# Patient Record
Sex: Female | Born: 1950 | Hispanic: Yes | Smoking: Never smoker
Health system: Southern US, Community
[De-identification: ages and names within clinical notes are randomized; demographics above are authoritative.]

## PROBLEM LIST (undated history)

## (undated) DIAGNOSIS — E079 Disorder of thyroid, unspecified: Secondary | ICD-10-CM

---

## 1994-01-29 HISTORY — PX: BREAST BIOPSY: SHX20

## 2014-08-17 ENCOUNTER — Other Ambulatory Visit: Payer: Self-pay | Admitting: *Deleted

## 2014-08-17 DIAGNOSIS — Z1231 Encounter for screening mammogram for malignant neoplasm of breast: Secondary | ICD-10-CM

## 2014-09-02 ENCOUNTER — Ambulatory Visit
Admission: RE | Admit: 2014-09-02 | Discharge: 2014-09-02 | Disposition: A | Discharge status: Home / Self Care | Payer: Medicare HMO | Source: Ambulatory Visit | Attending: *Deleted | Admitting: *Deleted

## 2014-09-02 DIAGNOSIS — Z1231 Encounter for screening mammogram for malignant neoplasm of breast: Secondary | ICD-10-CM

## 2014-09-06 ENCOUNTER — Other Ambulatory Visit: Payer: Self-pay | Admitting: *Deleted

## 2014-09-06 DIAGNOSIS — R928 Other abnormal and inconclusive findings on diagnostic imaging of breast: Secondary | ICD-10-CM

## 2014-09-10 ENCOUNTER — Ambulatory Visit
Admission: RE | Admit: 2014-09-10 | Discharge: 2014-09-10 | Disposition: A | Discharge status: Home / Self Care | Payer: Medicare HMO | Source: Ambulatory Visit | Attending: *Deleted | Admitting: *Deleted

## 2014-09-10 DIAGNOSIS — R928 Other abnormal and inconclusive findings on diagnostic imaging of breast: Secondary | ICD-10-CM

## 2015-08-18 ENCOUNTER — Encounter (HOSPITAL_COMMUNITY): Payer: Self-pay | Admitting: Emergency Medicine

## 2015-08-18 ENCOUNTER — Emergency Department (HOSPITAL_COMMUNITY)
Admission: EM | Admit: 2015-08-18 | Discharge: 2015-08-18 | Disposition: A | Discharge status: Home / Self Care | Payer: PRIVATE HEALTH INSURANCE | Attending: Emergency Medicine | Admitting: Emergency Medicine

## 2015-08-18 DIAGNOSIS — J069 Acute upper respiratory infection, unspecified: Secondary | ICD-10-CM

## 2015-08-18 DIAGNOSIS — H6092 Unspecified otitis externa, left ear: Secondary | ICD-10-CM | POA: Insufficient documentation

## 2015-08-18 DIAGNOSIS — J309 Allergic rhinitis, unspecified: Secondary | ICD-10-CM | POA: Insufficient documentation

## 2015-08-18 HISTORY — DX: Disorder of thyroid, unspecified: E07.9

## 2015-08-18 MED ORDER — BENZONATATE 100 MG PO CAPS: 100.0000 mg | ORAL_CAPSULE | Freq: Three times a day (TID) | ORAL | Status: AC | PRN

## 2015-08-18 MED ORDER — CIPROFLOXACIN-DEXAMETHASONE 0.3-0.1 % OT SUSP
4.0000 [drp] | Freq: Two times a day (BID) | OTIC | Status: AC
  Administered 2015-08-18: 4 [drp] via OTIC
  Filled 2015-08-18: qty 7.5

## 2015-08-18 MED ORDER — CETIRIZINE HCL 10 MG PO TABS: 10.0000 mg | ORAL_TABLET | Freq: Every day | ORAL | Status: AC

## 2015-08-18 MED ORDER — FLUTICASONE PROPIONATE 50 MCG/ACT NA SUSP: 2.0000 | Freq: Every day | NASAL | Status: AC

## 2015-08-18 NOTE — ED Provider Notes (Signed)
CSN: [id number]     Arrival date & time 08/18/15  [id number] History   First MD Initiated Contact with Patient 08/18/15 [phone]     Chief Complaint  Patient presents with  . URI    Haley Haynes is a 65 y.o. female who presents to the ED complaining of A cough that is been ongoing for approximately 5 weeks with associated symptoms of nasal congestion, postnasal drip and sinus pressure. Patient also reports her left ear has had pain and pressure as well as muffled hearing. She reports about one week ago she went to [place] where she had a CXR that was unremarkable. She was not sent home with any medications. She has been using home remedies to help with her symptoms. She reports her symptoms began when she came to the area to visit her mother. She typically lives in [place]. Patient denies fevers, shortness of breath, chest pain, abdominal pain, nausea, vomiting, diarrhea, headaches, sore throat, trouble swallowing, or rashes.  Patient is a 65 y.o. female presenting with URI. The history is provided by the patient. No language interpreter was used.  URI Presenting symptoms: congestion, cough, ear pain and rhinorrhea   Presenting symptoms: no fever and no sore throat   Associated symptoms: no headaches, no neck pain and no wheezing     Past Medical History  Diagnosis Date  . Thyroid disease    History reviewed. No pertinent past surgical history. History reviewed. No pertinent family history. Social History  Substance Use Topics  . Smoking status: Never Smoker   . Smokeless tobacco: None  . Alcohol Use: None   OB History    No data available     Review of Systems  Constitutional: Negative for fever and chills.  HENT: Positive for congestion, ear pain, postnasal drip and rhinorrhea. Negative for ear discharge, facial swelling, mouth sores, nosebleeds, sore throat and trouble swallowing.   Eyes: Negative for visual disturbance.  Respiratory: Positive for cough. Negative for chest  tightness, shortness of breath and wheezing.   Cardiovascular: Negative for chest pain.  Gastrointestinal: Negative for nausea, vomiting and abdominal pain.  Musculoskeletal: Negative for back pain, neck pain and neck stiffness.  Skin: Negative for rash.  Neurological: Negative for dizziness, light-headedness and headaches.      Allergies  Benadryl and Tylenol  Home Medications   Prior to Admission medications   Medication Sig Start Date End Date Taking? Authorizing Provider  benzonatate (TESSALON) 100 MG capsule Take 1 capsule (100 mg total) by mouth 3 (three) times daily as needed for cough. 08/18/15   Waynetta Pean, PA-C  cetirizine (ZYRTEC ALLERGY) 10 MG tablet Take 1 tablet (10 mg total) by mouth daily. 08/18/15   Waynetta Pean, PA-C  fluticasone (FLONASE) 50 MCG/ACT nasal spray Place 2 sprays into both nostrils daily. 08/18/15   Waynetta Pean, PA-C   BP 127/76 mmHg  Pulse 71  Temp(Src) 97.9 F (36.6 C) (Oral)  Resp 18  SpO2 98% Physical Exam  Constitutional: She appears well-developed and well-nourished. No distress.  Nontoxic appearing.  HENT:  Head: Normocephalic and atraumatic.  Right Ear: External ear normal.  Left Ear: External ear normal.  Mouth/Throat: Oropharynx is clear and moist. No oropharyngeal exudate.  No tonsillar Birtcher exudates. Evidence of postnasal drip. Uvula is midline without edema. Will left ear with mild external auditory canal edema with whitish discharge. TM is partially visualized and appears intact. Hearing is decreased in her left ear. Right ear normal. No TM erythema or loss  of landmarks. No mastoid tenderness bilaterally. Boggy nasal turbinates bilaterally.  Eyes: Conjunctivae are normal. Pupils are equal, round, and reactive to light. Right eye exhibits no discharge. Left eye exhibits no discharge.  Neck: Normal range of motion. Neck supple. No JVD present. No tracheal deviation present.  Cardiovascular: Normal rate, regular rhythm,  normal heart sounds and intact distal pulses.   Pulmonary/Chest: Effort normal and breath sounds normal. No stridor. No respiratory distress. She has no wheezes. She has no rales.  Lungs are clear to auscultation bilaterally. No increased work of breathing.  Abdominal: Soft. There is no tenderness. There is no guarding.  Lymphadenopathy:    She has no cervical adenopathy.  Neurological: She is alert. Coordination normal.  Skin: Skin is warm and dry. No rash noted. She is not diaphoretic. No erythema. No pallor.  Psychiatric: She has a normal mood and affect. Her behavior is normal.  Nursing note and vitals reviewed.   ED Course  Procedures (including critical care time) Labs Review Labs Reviewed - No data to display  Imaging Review No results found.    EKG Interpretation None      Filed Vitals:   08/18/15 0838  BP: 127/76  Pulse: 71  Temp: 97.9 F (36.6 C)  TempSrc: Oral  Resp: 18  SpO2: 98%     MDM   Meds given in ED:  Medications  ciprofloxacin-dexamethasone (CIPRODEX) 0.3-0.1 % otic suspension 4 drop (not administered)    New Prescriptions   BENZONATATE (TESSALON) 100 MG CAPSULE    Take 1 capsule (100 mg total) by mouth 3 (three) times daily as needed for cough.   CETIRIZINE (ZYRTEC ALLERGY) 10 MG TABLET    Take 1 tablet (10 mg total) by mouth daily.   FLUTICASONE (FLONASE) 50 MCG/ACT NASAL SPRAY    Place 2 sprays into both nostrils daily.    Final diagnoses:  URI (upper respiratory infection)  Allergic rhinitis, unspecified allergic rhinitis type  Otitis externa, left   This  is a 65 y.o. female who presents to the ED complaining of A cough that is been ongoing for approximately 5 weeks with associated symptoms of nasal congestion, postnasal drip and sinus pressure. Patient also reports her left ear has had pain and pressure as well as muffled hearing. She reports about one week ago she went to [place] where she had a CXR that was unremarkable. She was not  sent home with any medications. She has been using home remedies to help with her symptoms. She reports her symptoms began when she came to the area to visit her mother.  On exam the patient is afebrile nontoxic appearing. Her lungs are clear to auscultation bilaterally. She has evidence of postnasal drip boggy nasal turbinates. She has otitis externa on her left ear. No mastoid tenderness. She is not diabetic. No concern for malignant otitis externa. Patient had a negative chest x-ray recently. I suspect patient's cough is coming from her allergy and upper respiratory symptoms. Will start her on Ciprodex in the emergency department and discharged with this medication. Advised to use 4 drops in her left ear twice a day. Advised if she continues to have problems with muffled hearing she is to follow-up with ENT. Will also start on Zyrtec, Tessalon Perles and zyrtec, flonase nasal spray. I discussed strict and specific return precautions. I advised the patient to follow-up with their primary care provider this week. I advised the patient to return to the emergency department with new or worsening symptoms  or new concerns. The patient verbalized understanding and agreement with plan.       Waynetta Pean, PA-C 08/18/15 [id number]  Margette Fast, MD 08/19/15 [phone]

## 2015-08-18 NOTE — ED Notes (Signed)
PT ambulated with baseline gait; VSS; A&Ox3; no signs of distress; respirations even and unlabored; skin warm and dry; no questions upon discharge.  

## 2015-08-18 NOTE — ED Notes (Signed)
Patient lives in [place]; has been here for 6 weeks. Has cough- productive with clear phelgm. Left ear muffled. Went to ER at [place]; chest xray clear. Sinus pressure reported currently. Denies night sweats or fevers.

## 2015-08-18 NOTE — Discharge Instructions (Signed)
Otitis Externa Otitis externa is a bacterial or fungal infection of the outer ear canal. This is the area from the eardrum to the outside of the ear. Otitis externa is sometimes called "swimmer's ear." CAUSES  Possible causes of infection include:  Swimming in dirty water.  Moisture remaining in the ear after swimming or bathing.  Mild injury (trauma) to the ear.  Objects stuck in the ear (foreign body).  Cuts or scrapes (abrasions) on the outside of the ear. SIGNS AND SYMPTOMS  The first symptom of infection is often itching in the ear canal. Later signs and symptoms may include swelling and redness of the ear canal, ear pain, and yellowish-white fluid (pus) coming from the ear. The ear pain may be worse when pulling on the earlobe. DIAGNOSIS  Your health care provider will perform a physical exam. A sample of fluid may be taken from the ear and examined for bacteria or fungi. TREATMENT  Antibiotic ear drops are often given for 10 to 14 days. Treatment may also include pain medicine or corticosteroids to reduce itching and swelling. HOME CARE INSTRUCTIONS   Apply antibiotic ear drops to the ear canal as prescribed by your health care provider.  Take medicines only as directed by your health care provider.  If you have diabetes, follow any additional treatment instructions from your health care provider.  Keep all follow-up visits as directed by your health care provider. PREVENTION   Keep your ear dry. Use the corner of a towel to absorb water out of the ear canal after swimming or bathing.  Avoid scratching or putting objects inside your ear. This can damage the ear canal or remove the protective wax that lines the canal. This makes it easier for bacteria and fungi to grow.  Avoid swimming in lakes, polluted water, or poorly chlorinated pools.  You may use ear drops made of rubbing alcohol and vinegar after swimming. Combine equal parts of white vinegar and alcohol in a bottle.  Put 3 or 4 drops into each ear after swimming. SEEK MEDICAL CARE IF:   You have a fever.  Your ear is still red, swollen, painful, or draining pus after 3 days.  Your redness, swelling, or pain gets worse.  You have a severe headache.  You have redness, swelling, pain, or tenderness in the area behind your ear. MAKE SURE YOU:   Understand these instructions.  Will watch your condition.  Will get help right away if you are not doing well or get worse.   This information is not intended to replace advice given to you by your health care provider. Make sure you discuss any questions you have with your health care provider.   Document Released: 01/15/2005 Document Revised: 02/05/2014 Document Reviewed: 02/01/2011 Elsevier Interactive Patient Education 2016 [employer].  Allergic Rhinitis Allergic rhinitis is when the mucous membranes in the nose respond to allergens. Allergens are particles in the air that cause your body to have an allergic reaction. This causes you to release allergic antibodies. Through a chain of events, these eventually cause you to release histamine into the blood stream. Although meant to protect the body, it is this release of histamine that causes your discomfort, such as frequent sneezing, congestion, and an itchy, runny nose.  CAUSES Seasonal allergic rhinitis (hay fever) is caused by pollen allergens that may come from grasses, trees, and weeds. Year-round allergic rhinitis (perennial allergic rhinitis) is caused by allergens such as house dust mites, pet dander, and mold spores. SYMPTOMS  Nasal stuffiness (congestion).  Itchy, runny nose with sneezing and tearing of the eyes. DIAGNOSIS Your health care provider can help you determine the allergen or allergens that trigger your symptoms. If you and your health care provider are unable to determine the allergen, skin or blood testing may be used. Your health care provider will diagnose your condition after  taking your health history and performing a physical exam. Your health care provider may assess you for other related conditions, such as asthma, pink eye, or an ear infection. TREATMENT Allergic rhinitis does not have a cure, but it can be controlled by:  Medicines that block allergy symptoms. These may include allergy shots, nasal sprays, and oral antihistamines.  Avoiding the allergen. Hay fever may often be treated with antihistamines in pill or nasal spray forms. Antihistamines block the effects of histamine. There are over-the-counter medicines that may help with nasal congestion and swelling around the eyes. Check with your health care provider before taking or giving this medicine. If avoiding the allergen or the medicine prescribed do not work, there are many new medicines your health care provider can prescribe. Stronger medicine may be used if initial measures are ineffective. Desensitizing injections can be used if medicine and avoidance does not work. Desensitization is when a patient is given ongoing shots until the body becomes less sensitive to the allergen. Make sure you follow up with your health care provider if problems continue. HOME CARE INSTRUCTIONS It is not possible to completely avoid allergens, but you can reduce your symptoms by taking steps to limit your exposure to them. It helps to know exactly what you are allergic to so that you can avoid your specific triggers. SEEK MEDICAL CARE IF:  You have a fever.  You develop a cough that does not stop easily (persistent).  You have shortness of breath.  You start wheezing.  Symptoms interfere with normal daily activities.   This information is not intended to replace advice given to you by your health care provider. Make sure you discuss any questions you have with your health care provider.   Document Released: 10/10/2000 Document Revised: 02/05/2014 Document Reviewed: 09/22/2012 Elsevier Interactive Patient  Education 2016 Elsevier Inc. Upper Respiratory Infection, Adult Most upper respiratory infections (URIs) are a viral infection of the air passages leading to the lungs. A URI affects the nose, throat, and upper air passages. The most common type of URI is nasopharyngitis and is typically referred to as "the common cold." URIs run their course and usually go away on their own. Most of the time, a URI does not require medical attention, but sometimes a bacterial infection in the upper airways can follow a viral infection. This is called a secondary infection. Sinus and middle ear infections are common types of secondary upper respiratory infections. Bacterial pneumonia can also complicate a URI. A URI can worsen asthma and chronic obstructive pulmonary disease (COPD). Sometimes, these complications can require emergency medical care and may be life threatening.  CAUSES Almost all URIs are caused by viruses. A virus is a type of germ and can spread from one person to another.  RISKS FACTORS You may be at risk for a URI if:   You smoke.   You have chronic heart or lung disease.  You have a weakened defense (immune) system.   You are very young or very old.   You have nasal allergies or asthma.  You work in crowded or poorly ventilated areas.  You work in health care  facilities or schools. SIGNS AND SYMPTOMS  Symptoms typically develop 2-3 days after you come in contact with a cold virus. Most viral URIs last 7-10 days. However, viral URIs from the influenza virus (flu virus) can last 14-18 days and are typically more severe. Symptoms may include:   Runny or stuffy (congested) nose.   Sneezing.   Cough.   Sore throat.   Headache.   Fatigue.   Fever.   Loss of appetite.   Pain in your forehead, behind your eyes, and over your cheekbones (sinus pain).  Muscle aches.  DIAGNOSIS  Your health care provider may diagnose a URI by:  Physical exam.  Tests to check  that your symptoms are not due to another condition such as:  Strep throat.  Sinusitis.  Pneumonia.  Asthma. TREATMENT  A URI goes away on its own with time. It cannot be cured with medicines, but medicines may be prescribed or recommended to relieve symptoms. Medicines may help:  Reduce your fever.  Reduce your cough.  Relieve nasal congestion. HOME CARE INSTRUCTIONS   Take medicines only as directed by your health care provider.   Gargle warm saltwater or take cough drops to comfort your throat as directed by your health care provider.  Use a warm mist humidifier or inhale steam from a shower to increase air moisture. This may make it easier to breathe.  Drink enough fluid to keep your urine clear or pale yellow.   Eat soups and other clear broths and maintain good nutrition.   Rest as needed.   Return to work when your temperature has returned to normal or as your health care provider advises. You may need to stay home longer to avoid infecting others. You can also use a face mask and careful hand washing to prevent spread of the virus.  Increase the usage of your inhaler if you have asthma.   Do not use any tobacco products, including cigarettes, chewing tobacco, or electronic cigarettes. If you need help quitting, ask your health care provider. PREVENTION  The best way to protect yourself from getting a cold is to practice good hygiene.   Avoid oral or hand contact with people with cold symptoms.   Wash your hands often if contact occurs.  There is no clear evidence that vitamin C, vitamin E, echinacea, or exercise reduces the chance of developing a cold. However, it is always recommended to get plenty of rest, exercise, and practice good nutrition.  SEEK MEDICAL CARE IF:   You are getting worse rather than better.   Your symptoms are not controlled by medicine.   You have chills.  You have worsening shortness of breath.  You have brown or red  mucus.  You have yellow or brown nasal discharge.  You have pain in your face, especially when you bend forward.  You have a fever.  You have swollen neck glands.  You have pain while swallowing.  You have white areas in the back of your throat. SEEK IMMEDIATE MEDICAL CARE IF:   You have severe or persistent:  Headache.  Ear pain.  Sinus pain.  Chest pain.  You have chronic lung disease and any of the following:  Wheezing.  Prolonged cough.  Coughing up blood.  A change in your usual mucus.  You have a stiff neck.  You have changes in your:  Vision.  Hearing.  Thinking.  Mood. MAKE SURE YOU:   Understand these instructions.  Will watch your condition.  Will get  help right away if you are not doing well or get worse.   This information is not intended to replace advice given to you by your health care provider. Make sure you discuss any questions you have with your health care provider.   Document Released: 07/11/2000 Document Revised: 06/01/2014 Document Reviewed: 04/22/2013 Elsevier Interactive Patient Education [employer].

## 2015-08-18 NOTE — ED Notes (Signed)
Pharmacy working on ear drops.

## 2015-11-03 ENCOUNTER — Other Ambulatory Visit: Payer: Self-pay | Admitting: Physician Assistant

## 2015-11-03 DIAGNOSIS — E079 Disorder of thyroid, unspecified: Secondary | ICD-10-CM | POA: Diagnosis not present

## 2015-11-03 DIAGNOSIS — Z23 Encounter for immunization: Secondary | ICD-10-CM | POA: Diagnosis not present

## 2015-11-03 DIAGNOSIS — Z1231 Encounter for screening mammogram for malignant neoplasm of breast: Secondary | ICD-10-CM

## 2015-11-03 DIAGNOSIS — E041 Nontoxic single thyroid nodule: Secondary | ICD-10-CM | POA: Diagnosis not present

## 2015-11-03 DIAGNOSIS — F419 Anxiety disorder, unspecified: Secondary | ICD-10-CM | POA: Diagnosis not present

## 2015-11-22 ENCOUNTER — Ambulatory Visit
Admission: RE | Admit: 2015-11-22 | Discharge: 2015-11-22 | Disposition: A | Discharge status: Home / Self Care | Payer: Commercial Managed Care - HMO | Source: Ambulatory Visit | Attending: Physician Assistant | Admitting: Physician Assistant

## 2015-11-22 DIAGNOSIS — Z1231 Encounter for screening mammogram for malignant neoplasm of breast: Secondary | ICD-10-CM | POA: Diagnosis not present

## 2015-11-28 DIAGNOSIS — H04123 Dry eye syndrome of bilateral lacrimal glands: Secondary | ICD-10-CM | POA: Diagnosis not present

## 2015-11-28 DIAGNOSIS — H25813 Combined forms of age-related cataract, bilateral: Secondary | ICD-10-CM | POA: Diagnosis not present

## 2015-11-28 DIAGNOSIS — D3132 Benign neoplasm of left choroid: Secondary | ICD-10-CM | POA: Diagnosis not present

## 2015-12-19 DIAGNOSIS — R062 Wheezing: Secondary | ICD-10-CM | POA: Diagnosis not present

## 2015-12-19 DIAGNOSIS — J209 Acute bronchitis, unspecified: Secondary | ICD-10-CM | POA: Diagnosis not present

## 2015-12-19 DIAGNOSIS — R05 Cough: Secondary | ICD-10-CM | POA: Diagnosis not present

## 2016-05-16 ENCOUNTER — Other Ambulatory Visit: Payer: Self-pay | Admitting: Physician Assistant

## 2016-05-16 DIAGNOSIS — E041 Nontoxic single thyroid nodule: Secondary | ICD-10-CM | POA: Diagnosis not present

## 2016-05-16 DIAGNOSIS — I10 Essential (primary) hypertension: Secondary | ICD-10-CM | POA: Diagnosis not present

## 2016-05-16 DIAGNOSIS — Z0000 Encounter for general adult medical examination without abnormal findings: Secondary | ICD-10-CM | POA: Diagnosis not present

## 2016-05-16 DIAGNOSIS — Z23 Encounter for immunization: Secondary | ICD-10-CM | POA: Diagnosis not present

## 2016-05-16 DIAGNOSIS — E039 Hypothyroidism, unspecified: Secondary | ICD-10-CM | POA: Diagnosis not present

## 2016-05-16 DIAGNOSIS — F419 Anxiety disorder, unspecified: Secondary | ICD-10-CM | POA: Diagnosis not present

## 2016-05-23 ENCOUNTER — Ambulatory Visit
Admission: RE | Admit: 2016-05-23 | Discharge: 2016-05-23 | Disposition: A | Discharge status: Home / Self Care | Payer: Medicare HMO | Source: Ambulatory Visit | Attending: Physician Assistant | Admitting: Physician Assistant

## 2016-05-23 DIAGNOSIS — E041 Nontoxic single thyroid nodule: Secondary | ICD-10-CM

## 2016-05-28 DIAGNOSIS — H25813 Combined forms of age-related cataract, bilateral: Secondary | ICD-10-CM | POA: Diagnosis not present

## 2016-05-28 DIAGNOSIS — D3132 Benign neoplasm of left choroid: Secondary | ICD-10-CM | POA: Diagnosis not present

## 2016-05-28 DIAGNOSIS — H04123 Dry eye syndrome of bilateral lacrimal glands: Secondary | ICD-10-CM | POA: Diagnosis not present

## 2016-05-31 DIAGNOSIS — H00029 Hordeolum internum unspecified eye, unspecified eyelid: Secondary | ICD-10-CM | POA: Diagnosis not present

## 2016-06-20 DIAGNOSIS — M8588 Other specified disorders of bone density and structure, other site: Secondary | ICD-10-CM | POA: Diagnosis not present

## 2016-09-06 DIAGNOSIS — M25551 Pain in right hip: Secondary | ICD-10-CM | POA: Diagnosis not present

## 2016-09-06 DIAGNOSIS — M545 Low back pain: Secondary | ICD-10-CM | POA: Diagnosis not present

## 2016-09-06 DIAGNOSIS — M7061 Trochanteric bursitis, right hip: Secondary | ICD-10-CM | POA: Diagnosis not present

## 2016-11-01 ENCOUNTER — Other Ambulatory Visit: Payer: Self-pay | Admitting: Physician Assistant

## 2016-11-01 DIAGNOSIS — Z1231 Encounter for screening mammogram for malignant neoplasm of breast: Secondary | ICD-10-CM

## 2016-11-15 DIAGNOSIS — E039 Hypothyroidism, unspecified: Secondary | ICD-10-CM | POA: Diagnosis not present

## 2016-11-15 DIAGNOSIS — E7800 Pure hypercholesterolemia, unspecified: Secondary | ICD-10-CM | POA: Diagnosis not present

## 2016-11-15 DIAGNOSIS — I10 Essential (primary) hypertension: Secondary | ICD-10-CM | POA: Diagnosis not present

## 2016-11-15 DIAGNOSIS — Z23 Encounter for immunization: Secondary | ICD-10-CM | POA: Diagnosis not present

## 2016-11-15 DIAGNOSIS — K649 Unspecified hemorrhoids: Secondary | ICD-10-CM | POA: Diagnosis not present

## 2016-11-15 DIAGNOSIS — F419 Anxiety disorder, unspecified: Secondary | ICD-10-CM | POA: Diagnosis not present

## 2016-11-15 DIAGNOSIS — E041 Nontoxic single thyroid nodule: Secondary | ICD-10-CM | POA: Diagnosis not present

## 2016-11-26 ENCOUNTER — Ambulatory Visit
Admission: RE | Admit: 2016-11-26 | Discharge: 2016-11-26 | Disposition: A | Discharge status: Home / Self Care | Payer: Medicare HMO | Source: Ambulatory Visit | Attending: Physician Assistant | Admitting: Physician Assistant

## 2016-11-26 DIAGNOSIS — Z1231 Encounter for screening mammogram for malignant neoplasm of breast: Secondary | ICD-10-CM | POA: Diagnosis not present

## 2016-12-18 DIAGNOSIS — K649 Unspecified hemorrhoids: Secondary | ICD-10-CM | POA: Diagnosis not present

## 2016-12-18 DIAGNOSIS — K625 Hemorrhage of anus and rectum: Secondary | ICD-10-CM | POA: Diagnosis not present

## 2016-12-18 DIAGNOSIS — K5900 Constipation, unspecified: Secondary | ICD-10-CM | POA: Diagnosis not present

## 2017-02-25 DIAGNOSIS — E7800 Pure hypercholesterolemia, unspecified: Secondary | ICD-10-CM | POA: Diagnosis not present

## 2017-05-22 ENCOUNTER — Other Ambulatory Visit: Payer: Self-pay | Admitting: Physician Assistant

## 2017-05-22 DIAGNOSIS — F419 Anxiety disorder, unspecified: Secondary | ICD-10-CM | POA: Diagnosis not present

## 2017-05-22 DIAGNOSIS — M545 Low back pain: Secondary | ICD-10-CM | POA: Diagnosis not present

## 2017-05-22 DIAGNOSIS — Z0000 Encounter for general adult medical examination without abnormal findings: Secondary | ICD-10-CM | POA: Diagnosis not present

## 2017-05-22 DIAGNOSIS — E039 Hypothyroidism, unspecified: Secondary | ICD-10-CM | POA: Diagnosis not present

## 2017-05-22 DIAGNOSIS — E041 Nontoxic single thyroid nodule: Secondary | ICD-10-CM | POA: Diagnosis not present

## 2017-05-22 DIAGNOSIS — I10 Essential (primary) hypertension: Secondary | ICD-10-CM | POA: Diagnosis not present

## 2017-05-22 DIAGNOSIS — E7800 Pure hypercholesterolemia, unspecified: Secondary | ICD-10-CM | POA: Diagnosis not present

## 2017-05-22 DIAGNOSIS — M8580 Other specified disorders of bone density and structure, unspecified site: Secondary | ICD-10-CM | POA: Diagnosis not present

## 2017-05-30 DIAGNOSIS — H25813 Combined forms of age-related cataract, bilateral: Secondary | ICD-10-CM | POA: Diagnosis not present

## 2017-05-30 DIAGNOSIS — H04123 Dry eye syndrome of bilateral lacrimal glands: Secondary | ICD-10-CM | POA: Diagnosis not present

## 2017-05-30 DIAGNOSIS — D3132 Benign neoplasm of left choroid: Secondary | ICD-10-CM | POA: Diagnosis not present

## 2017-05-30 DIAGNOSIS — M545 Low back pain: Secondary | ICD-10-CM | POA: Diagnosis not present

## 2017-06-03 ENCOUNTER — Ambulatory Visit
Admission: RE | Admit: 2017-06-03 | Discharge: 2017-06-03 | Disposition: A | Discharge status: Home / Self Care | Payer: Medicare HMO | Source: Ambulatory Visit | Attending: Physician Assistant | Admitting: Physician Assistant

## 2017-06-03 DIAGNOSIS — M545 Low back pain: Secondary | ICD-10-CM | POA: Diagnosis not present

## 2017-06-03 DIAGNOSIS — E049 Nontoxic goiter, unspecified: Secondary | ICD-10-CM | POA: Diagnosis not present

## 2017-06-03 DIAGNOSIS — E041 Nontoxic single thyroid nodule: Secondary | ICD-10-CM

## 2017-06-05 DIAGNOSIS — M545 Low back pain: Secondary | ICD-10-CM | POA: Diagnosis not present

## 2017-07-24 DIAGNOSIS — M545 Low back pain: Secondary | ICD-10-CM | POA: Diagnosis not present

## 2018-12-22 IMAGING — US US THYROID
1 series · 13 of 25 positions shown · non-contrast
Comparison: None.

CLINICAL DATA: Palpable abnormality. Thyroid nodule felt on
physical examination.

EXAM:
THYROID ULTRASOUND
TECHNIQUE: Ultrasound examination of the thyroid gland and adjacent soft
tissues was performed.

[Series 1: us thyroid · 0.06mm/px · 13 of 41 slices shown]
[im 1/41]
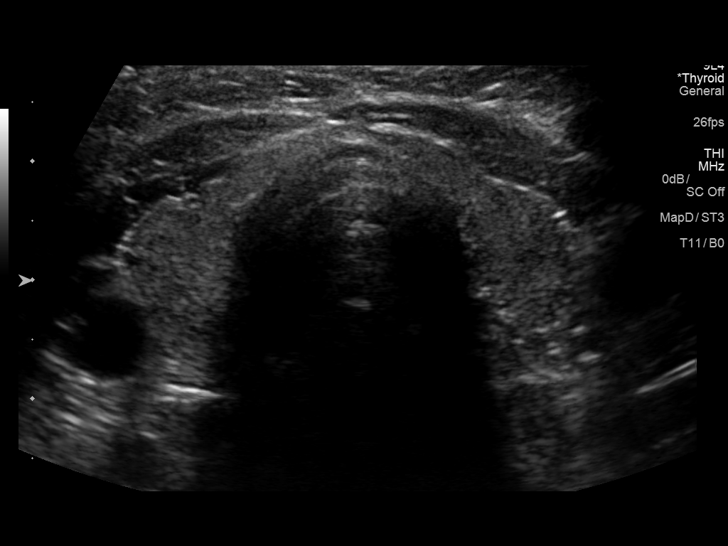
[im 4/41]
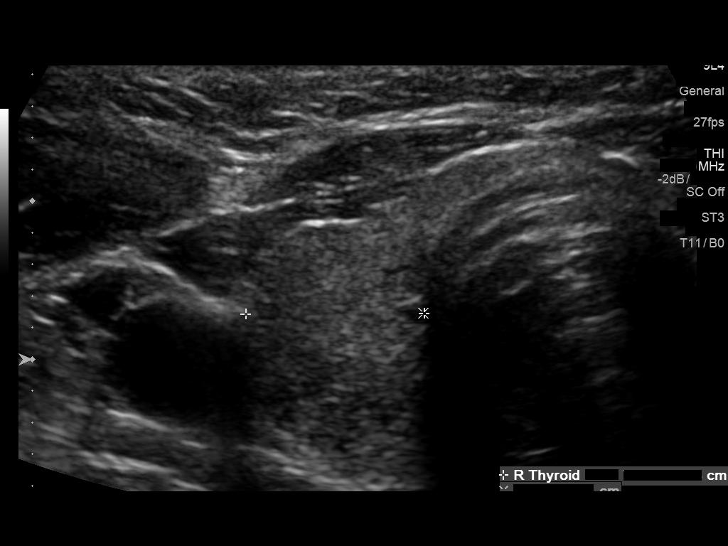
[im 7/41]
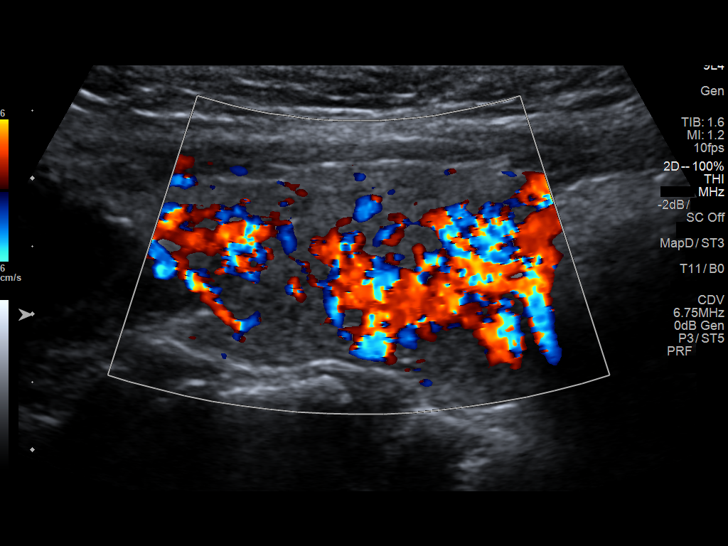
[im 11/41]
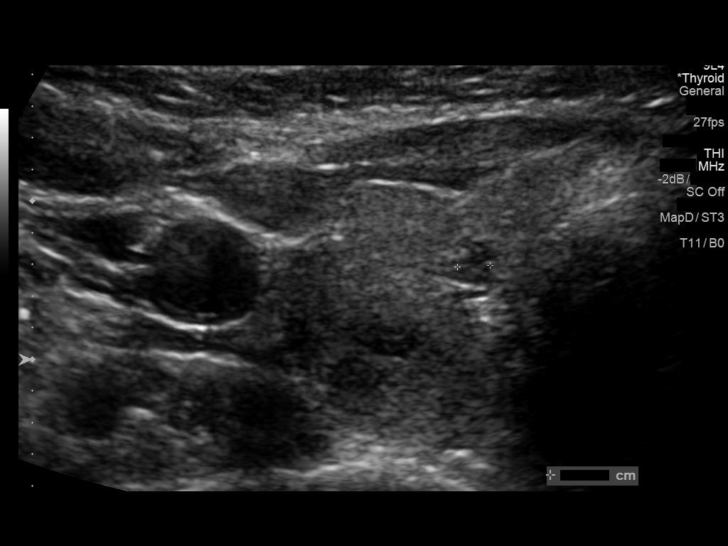
[im 14/41]
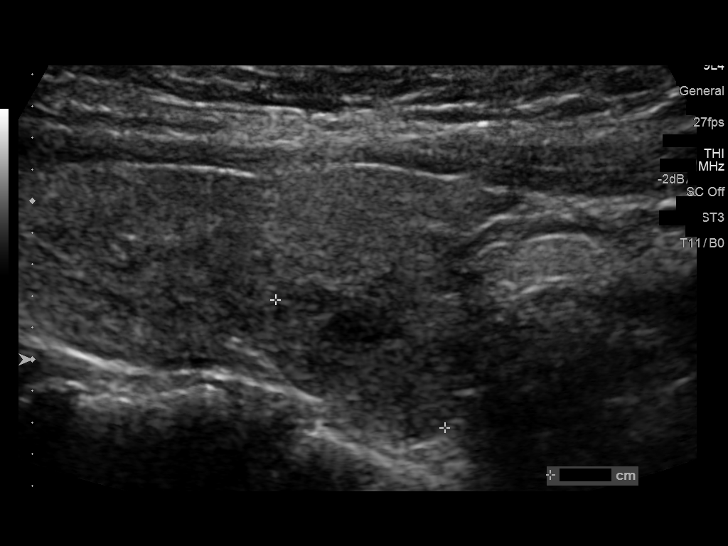
[im 17/41]
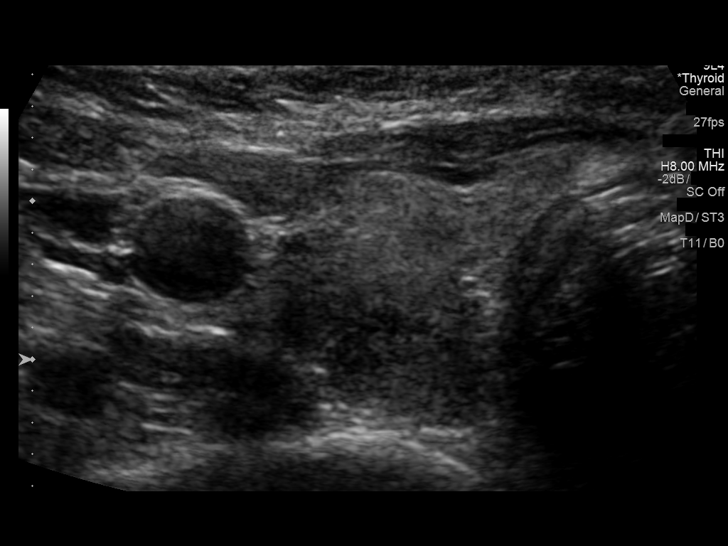
[im 21/41]
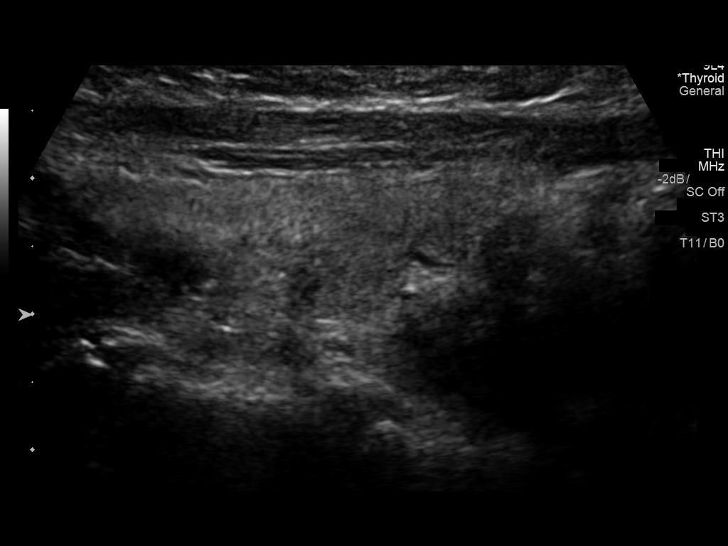
[im 24/41]
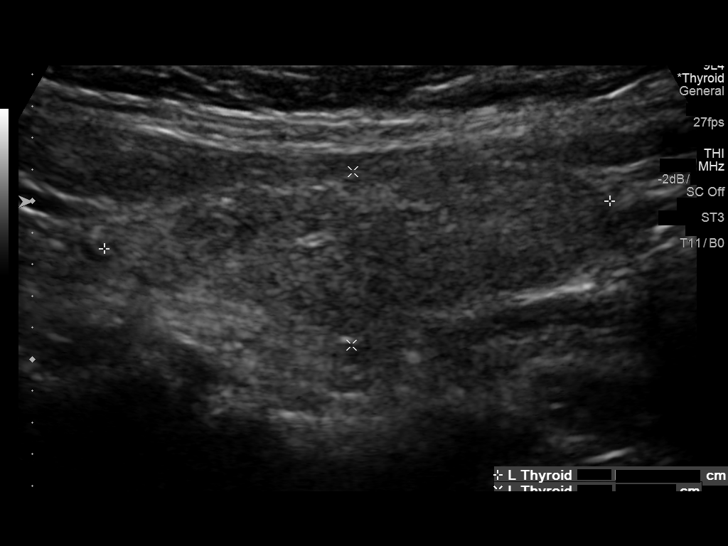
[im 27/41]
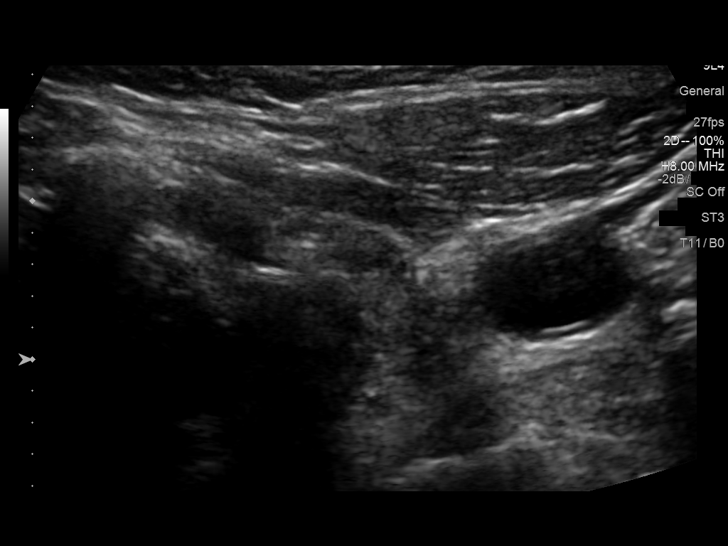
[im 31/41]
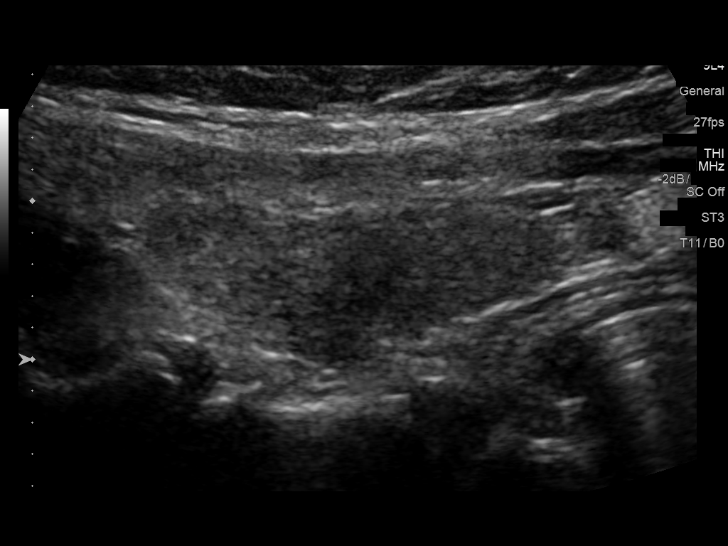
[im 34/41]
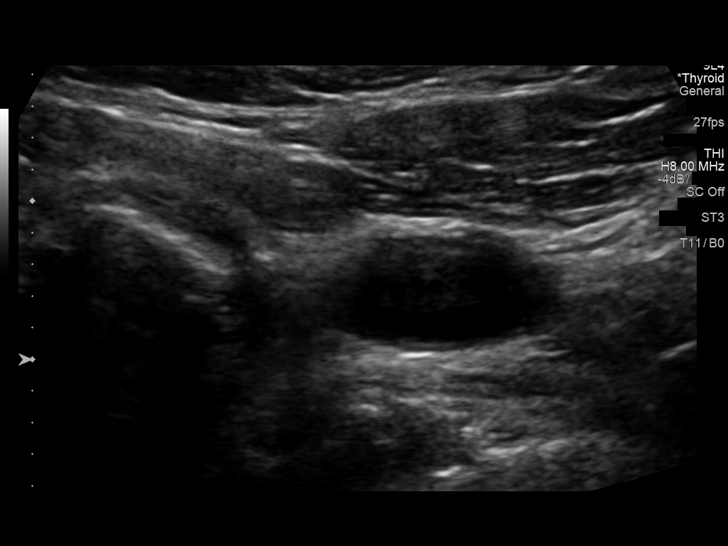
[im 37/41]
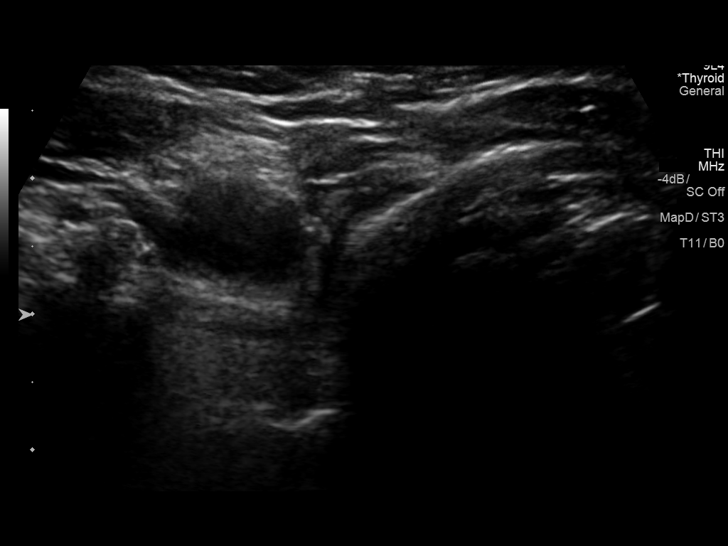
[im 41/41]
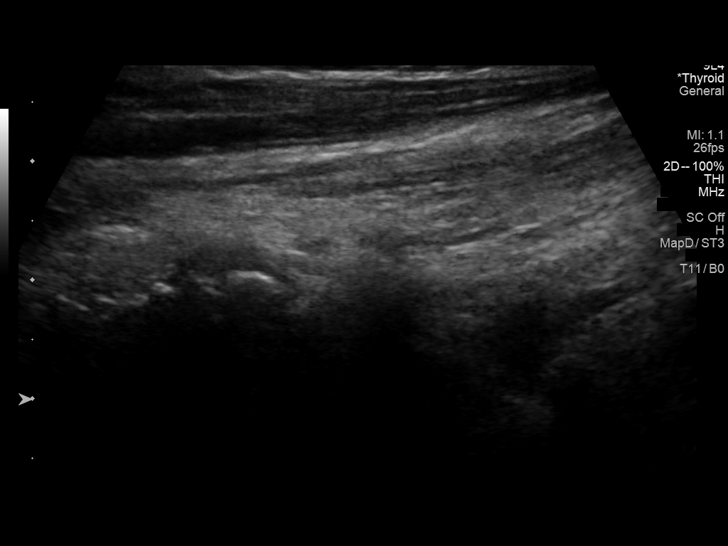

[13 of 25 positions shown; findings below may reference images not displayed]

FINDINGS: Parenchymal Echotexture: Moderately heterogenous - there is mild
diffuse glandular hyperemia (representative images 8 and 27).

Isthmus: Normal in size measuring 0.3 cm in diameter

Right lobe: Slightly diminutive in size measuring 3.7 x 1.6 x 1.1 cm

Left lobe: Slightly diminutive in size measuring 3.2 x 1.1 x 1.3 cm

_________________________________________________________

Estimated total number of nodules >/= 1 cm: 1

Number of spongiform nodules >/=  2 cm not described below (TR1): 0

Number of mixed cystic and solid nodules >/= 1.5 cm not described
below (TR2): 0

_________________________________________________________

Nodule # 1:

Location: Right; Inferior

Maximum size: 1.3 cm; Other 2 dimensions: 1.0 x 0.9 cm

Composition: solid/almost completely solid (2)

Echogenicity: hypoechoic (2)

Shape: not taller-than-wide (0)

Margins: ill-defined (0)

Echogenic foci: none (0)

ACR TI-RADS total points: 4.

ACR TI-RADS risk category: TR4 (4-6 points).

ACR TI-RADS recommendations:

*Given size (>/= 1 - 1.4 cm) and appearance, a follow-up ultrasound
in 1 year should be considered based on TI-RADS criteria.

_________________________________________________________

There is an additional punctate nodule/pseudo nodule within the mid/
medial aspect of the right lobe of the thyroid which measures
approximately 0.2 cm in diameter and is not meet imaging criteria to
recommend percutaneous sampling or dedicated follow-up.
IMPRESSION: 1. Slightly atrophic, heterogeneous and potentially hyperemic
thyroid gland - nonspecific though could be seen in the setting of
thyroiditis. Correlation with thyroid function tests is recommended.
2. Indeterminate approximately 1.3 cm nodule within the inferior
aspect the right lobe of the thyroid meets imaging criteria to
recommend a 1 year follow-up as clinically indicated.

The above is in keeping with the ACR TI-RADS recommendations - [HOSPITAL] 1477;[DATE].

## 2020-01-02 IMAGING — US US THYROID
1 series · 13 of 25 positions shown · non-contrast
Comparison: Prior thyroid ultrasound 05/23/2016

CLINICAL DATA: Goiter. 67-year-old female with a history of thyroid
nodule

EXAM:
THYROID ULTRASOUND
TECHNIQUE: Ultrasound examination of the thyroid gland and adjacent soft
tissues was performed.

[Series 1: us thyroid · 0.04mm/px · 13 of 53 slices shown]
[im 1/53]
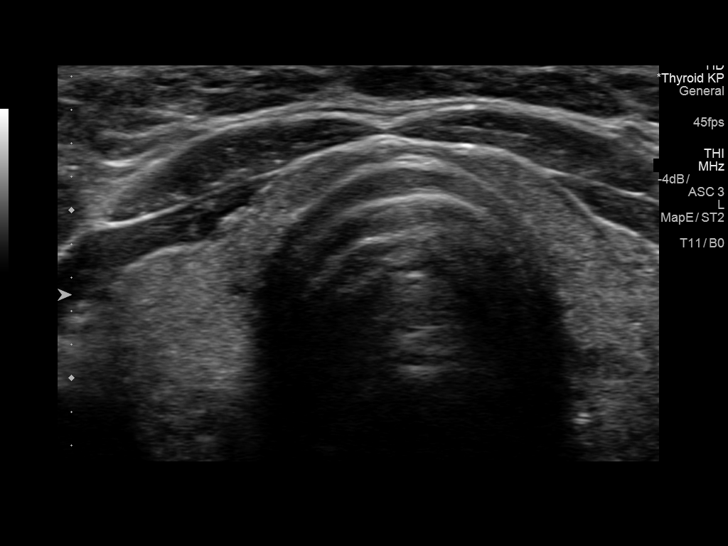
[im 5/53]
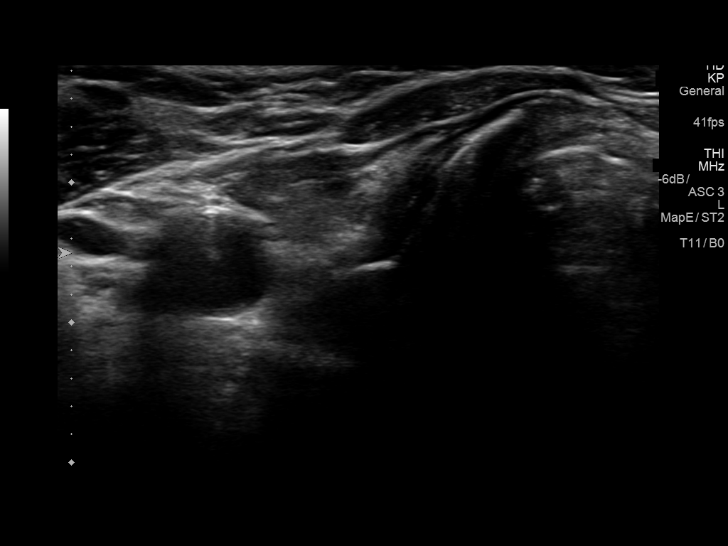
[im 9/53]
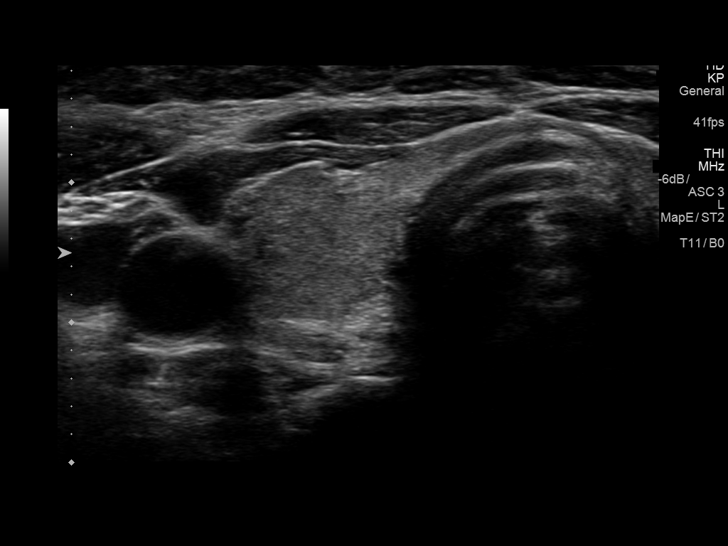
[im 14/53]
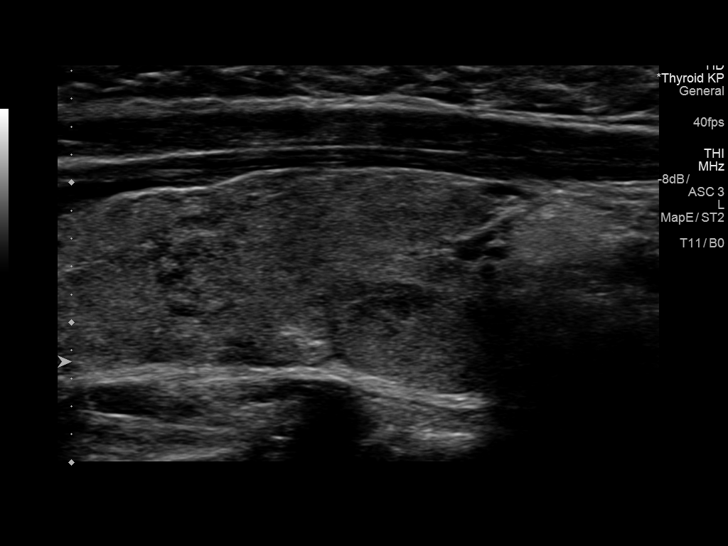
[im 18/53]
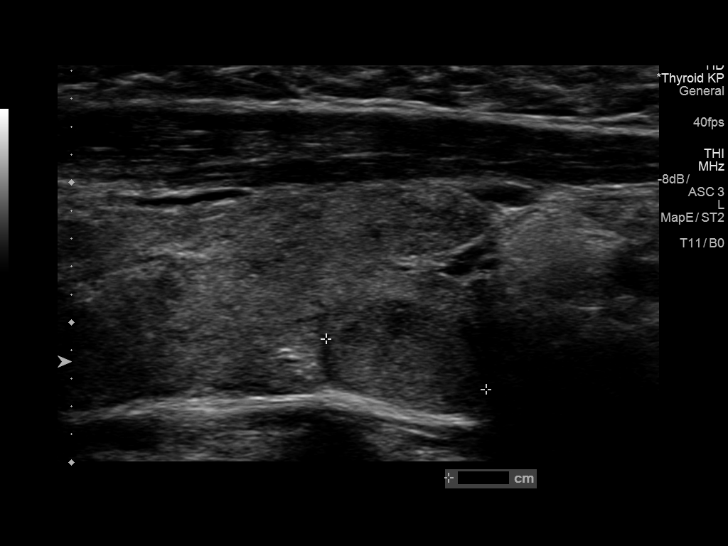
[im 22/53]
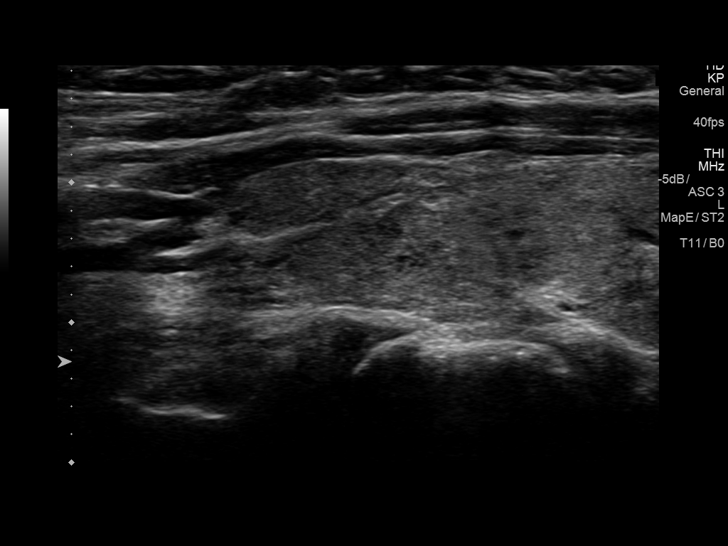
[im 27/53]
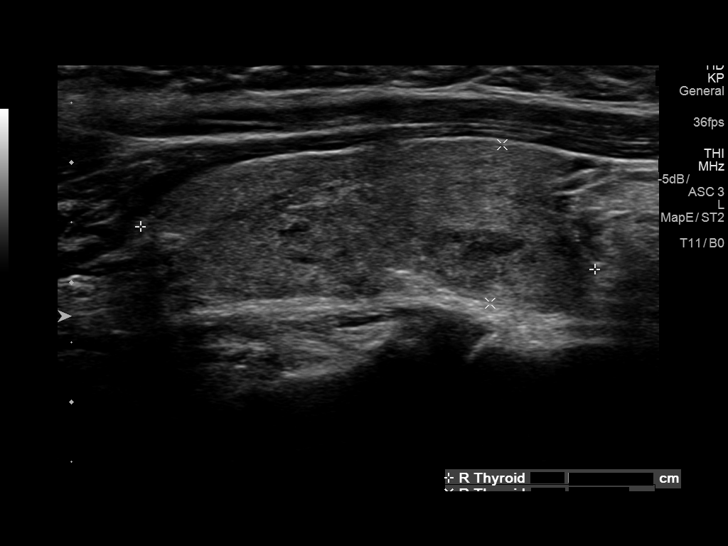
[im 31/53]
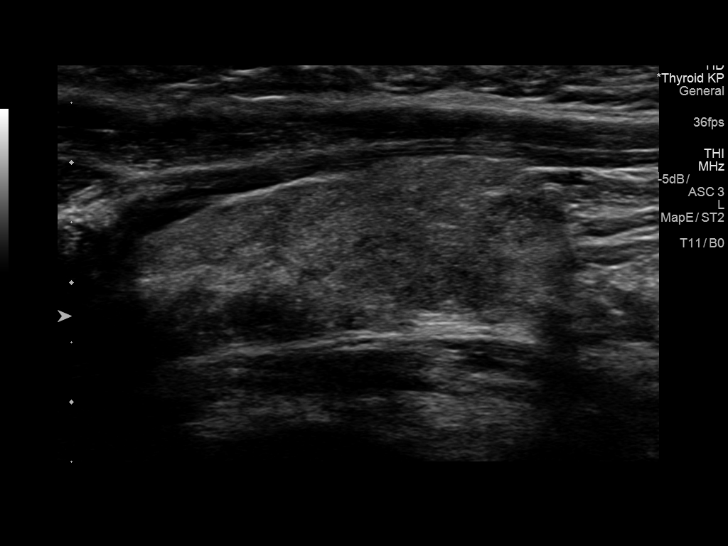
[im 35/53]
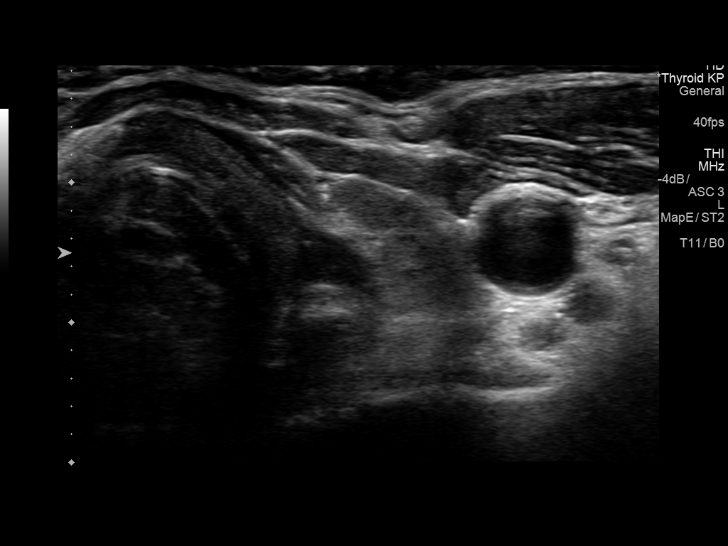
[im 40/53]
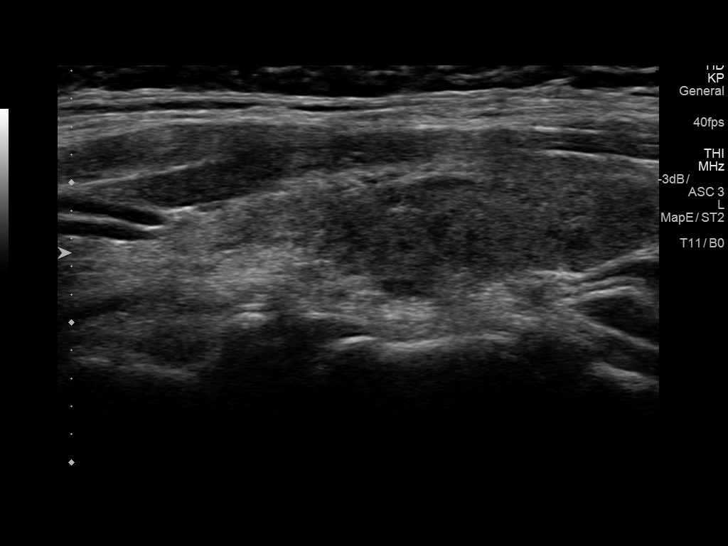
[im 44/53]
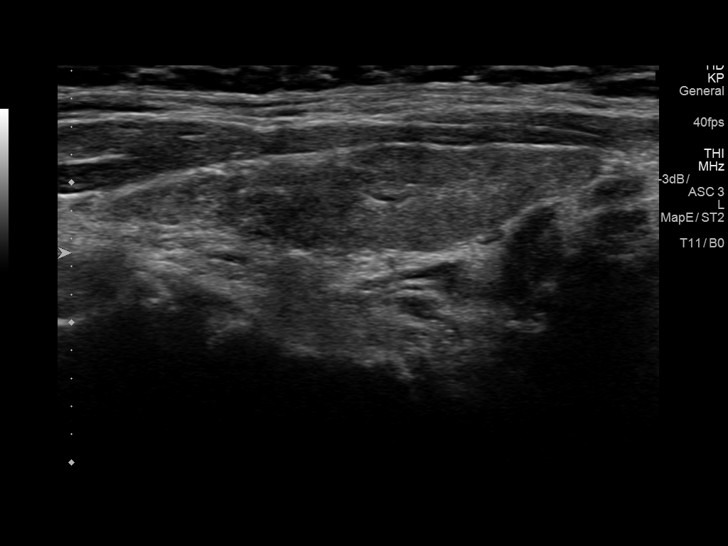
[im 48/53]
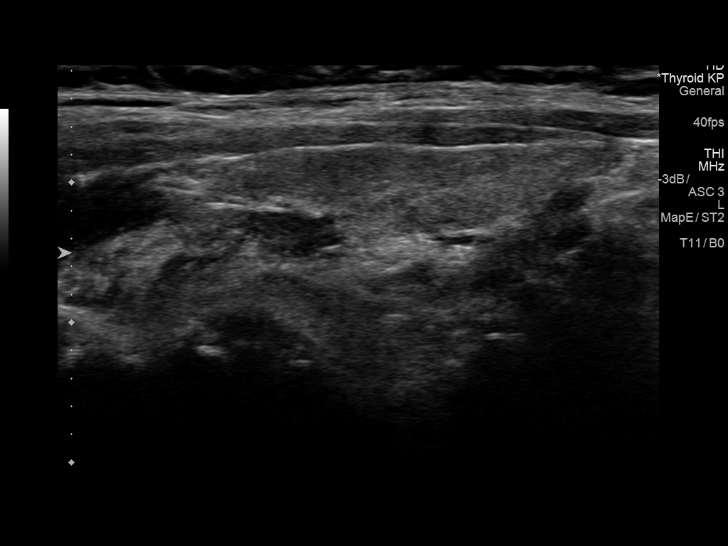
[im 53/53]
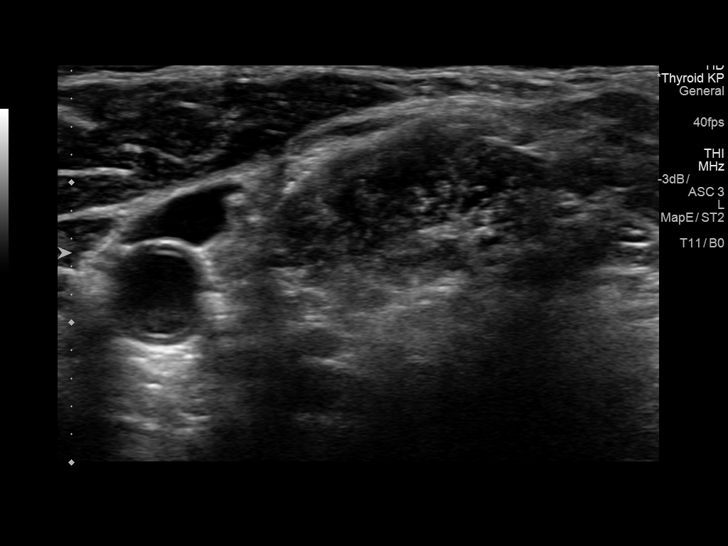

[13 of 25 positions shown; findings below may reference images not displayed]

FINDINGS: Parenchymal Echotexture: Moderately heterogenous

Isthmus: 0.2 cm

Right lobe: 3.8 x 1.3 x 1.3 cm

Left lobe: 3.6 x 0.8 x 1.2 cm

_________________________________________________________

Estimated total number of nodules >/= 1 cm: 1

Number of spongiform nodules >/=  2 cm not described below (TR1): 0

Number of mixed cystic and solid nodules >/= 1.5 cm not described
below (TR2): 0

_________________________________________________________

Nodule # 1:

Prior biopsy: No

Location: Right; Inferior

Maximum size: 1.2 cm; Other 2 dimensions: 0.8 x 0.9 cm, previously,
1.3 x 1.0 x 0.9 cm

Composition: solid/almost completely solid (2)

Echogenicity: hypoechoic (2)

Shape: not taller-than-wide (0)

Margins: smooth (0)

Echogenic foci: none (0)

ACR TI-RADS total points: 4.

ACR TI-RADS risk category:  TR4 (4-6 points).

Significant change in size (>/= 20% in two dimensions and minimal
increase of 2 mm): No

Change in features: No

Change in ACR TI-RADS risk category: No

ACR TI-RADS recommendations:

*Given size (>/= 1 - 1.4 cm) and appearance, a follow-up ultrasound
in 1 year should be considered based on TI-RADS criteria.

_________________________________________________________

No new nodules or suspicious findings identified.
IMPRESSION: Confirmed 1 year stability of the 1.2 cm TI-RADS category 4 nodule
in the right inferior gland. Nodule continues to meet criteria for
annual ultrasound follow-up until 5 year stability has been
confirmed.

The above is in keeping with the ACR TI-RADS recommendations - [HOSPITAL] 5206;[DATE].
